# Patient Record
Sex: Female | Born: 1950 | Race: White | Hispanic: No | Marital: Single
Health system: Southern US, Community
[De-identification: ages and names within clinical notes are randomized; demographics above are authoritative.]

---

## 2020-03-06 DIAGNOSIS — U071 COVID-19: Secondary | ICD-10-CM | POA: Diagnosis not present

## 2020-03-06 DIAGNOSIS — J9621 Acute and chronic respiratory failure with hypoxia: Secondary | ICD-10-CM

## 2020-03-06 DIAGNOSIS — J1282 Pneumonia due to coronavirus disease 2019: Secondary | ICD-10-CM | POA: Diagnosis not present

## 2020-03-06 DIAGNOSIS — I5022 Chronic systolic (congestive) heart failure: Secondary | ICD-10-CM

## 2020-03-07 DIAGNOSIS — I5022 Chronic systolic (congestive) heart failure: Secondary | ICD-10-CM

## 2020-03-07 DIAGNOSIS — J1282 Pneumonia due to coronavirus disease 2019: Secondary | ICD-10-CM

## 2020-03-07 DIAGNOSIS — J9621 Acute and chronic respiratory failure with hypoxia: Secondary | ICD-10-CM | POA: Diagnosis not present

## 2020-03-07 DIAGNOSIS — U071 COVID-19: Secondary | ICD-10-CM | POA: Diagnosis not present

## 2020-03-08 DIAGNOSIS — J1282 Pneumonia due to coronavirus disease 2019: Secondary | ICD-10-CM | POA: Diagnosis not present

## 2020-03-08 DIAGNOSIS — I5022 Chronic systolic (congestive) heart failure: Secondary | ICD-10-CM | POA: Diagnosis not present

## 2020-03-08 DIAGNOSIS — J9621 Acute and chronic respiratory failure with hypoxia: Secondary | ICD-10-CM

## 2020-03-08 DIAGNOSIS — U071 COVID-19: Secondary | ICD-10-CM

## 2020-03-09 DIAGNOSIS — J9621 Acute and chronic respiratory failure with hypoxia: Secondary | ICD-10-CM

## 2020-03-09 DIAGNOSIS — I5022 Chronic systolic (congestive) heart failure: Secondary | ICD-10-CM | POA: Diagnosis not present

## 2020-03-09 DIAGNOSIS — J1282 Pneumonia due to coronavirus disease 2019: Secondary | ICD-10-CM

## 2020-03-09 DIAGNOSIS — U071 COVID-19: Secondary | ICD-10-CM

## 2020-03-10 DIAGNOSIS — J9621 Acute and chronic respiratory failure with hypoxia: Secondary | ICD-10-CM

## 2020-03-10 DIAGNOSIS — U071 COVID-19: Secondary | ICD-10-CM | POA: Diagnosis not present

## 2020-03-10 DIAGNOSIS — J1282 Pneumonia due to coronavirus disease 2019: Secondary | ICD-10-CM

## 2020-03-10 DIAGNOSIS — I5022 Chronic systolic (congestive) heart failure: Secondary | ICD-10-CM | POA: Diagnosis not present

## 2020-03-11 DIAGNOSIS — I5022 Chronic systolic (congestive) heart failure: Secondary | ICD-10-CM | POA: Diagnosis not present

## 2020-03-11 DIAGNOSIS — J1282 Pneumonia due to coronavirus disease 2019: Secondary | ICD-10-CM | POA: Diagnosis not present

## 2020-03-11 DIAGNOSIS — J9621 Acute and chronic respiratory failure with hypoxia: Secondary | ICD-10-CM | POA: Diagnosis not present

## 2020-03-11 DIAGNOSIS — U071 COVID-19: Secondary | ICD-10-CM

## 2020-03-12 DIAGNOSIS — I5022 Chronic systolic (congestive) heart failure: Secondary | ICD-10-CM | POA: Diagnosis not present

## 2020-03-12 DIAGNOSIS — J9621 Acute and chronic respiratory failure with hypoxia: Secondary | ICD-10-CM

## 2020-03-12 DIAGNOSIS — J1282 Pneumonia due to coronavirus disease 2019: Secondary | ICD-10-CM | POA: Diagnosis not present

## 2020-03-12 DIAGNOSIS — U071 COVID-19: Secondary | ICD-10-CM | POA: Diagnosis not present

## 2020-03-20 DIAGNOSIS — U071 COVID-19: Secondary | ICD-10-CM | POA: Diagnosis not present

## 2020-03-20 DIAGNOSIS — I5022 Chronic systolic (congestive) heart failure: Secondary | ICD-10-CM

## 2020-03-20 DIAGNOSIS — J9621 Acute and chronic respiratory failure with hypoxia: Secondary | ICD-10-CM | POA: Diagnosis not present

## 2020-03-20 DIAGNOSIS — J1282 Pneumonia due to coronavirus disease 2019: Secondary | ICD-10-CM | POA: Diagnosis not present

## 2020-03-21 DIAGNOSIS — J1282 Pneumonia due to coronavirus disease 2019: Secondary | ICD-10-CM | POA: Diagnosis not present

## 2020-03-21 DIAGNOSIS — J9621 Acute and chronic respiratory failure with hypoxia: Secondary | ICD-10-CM

## 2020-03-21 DIAGNOSIS — U071 COVID-19: Secondary | ICD-10-CM | POA: Diagnosis not present

## 2020-03-21 DIAGNOSIS — I5022 Chronic systolic (congestive) heart failure: Secondary | ICD-10-CM | POA: Diagnosis not present

## 2020-03-22 DIAGNOSIS — J9621 Acute and chronic respiratory failure with hypoxia: Secondary | ICD-10-CM

## 2020-03-22 DIAGNOSIS — U071 COVID-19: Secondary | ICD-10-CM

## 2020-03-22 DIAGNOSIS — I5022 Chronic systolic (congestive) heart failure: Secondary | ICD-10-CM

## 2020-03-22 DIAGNOSIS — J1282 Pneumonia due to coronavirus disease 2019: Secondary | ICD-10-CM | POA: Diagnosis not present

## 2020-03-23 DIAGNOSIS — I5022 Chronic systolic (congestive) heart failure: Secondary | ICD-10-CM | POA: Diagnosis not present

## 2020-03-23 DIAGNOSIS — J9621 Acute and chronic respiratory failure with hypoxia: Secondary | ICD-10-CM

## 2020-03-23 DIAGNOSIS — J1282 Pneumonia due to coronavirus disease 2019: Secondary | ICD-10-CM

## 2020-03-23 DIAGNOSIS — U071 COVID-19: Secondary | ICD-10-CM | POA: Diagnosis not present

## 2020-03-24 DIAGNOSIS — U071 COVID-19: Secondary | ICD-10-CM

## 2020-03-24 DIAGNOSIS — J1282 Pneumonia due to coronavirus disease 2019: Secondary | ICD-10-CM

## 2020-03-24 DIAGNOSIS — J9621 Acute and chronic respiratory failure with hypoxia: Secondary | ICD-10-CM

## 2020-03-24 DIAGNOSIS — I5022 Chronic systolic (congestive) heart failure: Secondary | ICD-10-CM | POA: Diagnosis not present

## 2020-03-25 DIAGNOSIS — J9621 Acute and chronic respiratory failure with hypoxia: Secondary | ICD-10-CM

## 2020-03-25 DIAGNOSIS — U071 COVID-19: Secondary | ICD-10-CM

## 2020-03-25 DIAGNOSIS — I5022 Chronic systolic (congestive) heart failure: Secondary | ICD-10-CM

## 2020-03-25 DIAGNOSIS — J1282 Pneumonia due to coronavirus disease 2019: Secondary | ICD-10-CM

## 2020-03-26 DIAGNOSIS — J9621 Acute and chronic respiratory failure with hypoxia: Secondary | ICD-10-CM

## 2020-03-26 DIAGNOSIS — I5022 Chronic systolic (congestive) heart failure: Secondary | ICD-10-CM | POA: Diagnosis not present

## 2020-03-26 DIAGNOSIS — J1282 Pneumonia due to coronavirus disease 2019: Secondary | ICD-10-CM

## 2020-03-26 DIAGNOSIS — U071 COVID-19: Secondary | ICD-10-CM

## 2020-04-03 DIAGNOSIS — I5022 Chronic systolic (congestive) heart failure: Secondary | ICD-10-CM

## 2020-04-03 DIAGNOSIS — U071 COVID-19: Secondary | ICD-10-CM

## 2020-04-03 DIAGNOSIS — J1282 Pneumonia due to coronavirus disease 2019: Secondary | ICD-10-CM

## 2020-04-03 DIAGNOSIS — J9621 Acute and chronic respiratory failure with hypoxia: Secondary | ICD-10-CM

## 2020-04-04 DIAGNOSIS — J1282 Pneumonia due to coronavirus disease 2019: Secondary | ICD-10-CM

## 2020-04-04 DIAGNOSIS — J9621 Acute and chronic respiratory failure with hypoxia: Secondary | ICD-10-CM

## 2020-04-04 DIAGNOSIS — I5022 Chronic systolic (congestive) heart failure: Secondary | ICD-10-CM | POA: Diagnosis not present

## 2020-04-04 DIAGNOSIS — U071 COVID-19: Secondary | ICD-10-CM

## 2020-04-05 DIAGNOSIS — J9621 Acute and chronic respiratory failure with hypoxia: Secondary | ICD-10-CM

## 2020-04-05 DIAGNOSIS — U071 COVID-19: Secondary | ICD-10-CM

## 2020-04-05 DIAGNOSIS — I5022 Chronic systolic (congestive) heart failure: Secondary | ICD-10-CM

## 2020-04-05 DIAGNOSIS — J1282 Pneumonia due to coronavirus disease 2019: Secondary | ICD-10-CM

## 2020-04-06 DIAGNOSIS — J9621 Acute and chronic respiratory failure with hypoxia: Secondary | ICD-10-CM | POA: Diagnosis not present

## 2020-04-06 DIAGNOSIS — U071 COVID-19: Secondary | ICD-10-CM | POA: Diagnosis not present

## 2020-04-06 DIAGNOSIS — I5022 Chronic systolic (congestive) heart failure: Secondary | ICD-10-CM

## 2020-04-06 DIAGNOSIS — J1282 Pneumonia due to coronavirus disease 2019: Secondary | ICD-10-CM

## 2020-04-07 DIAGNOSIS — I5022 Chronic systolic (congestive) heart failure: Secondary | ICD-10-CM

## 2020-04-07 DIAGNOSIS — J1282 Pneumonia due to coronavirus disease 2019: Secondary | ICD-10-CM | POA: Diagnosis not present

## 2020-04-07 DIAGNOSIS — U071 COVID-19: Secondary | ICD-10-CM

## 2020-04-07 DIAGNOSIS — J9621 Acute and chronic respiratory failure with hypoxia: Secondary | ICD-10-CM | POA: Diagnosis not present

## 2020-04-08 DIAGNOSIS — U071 COVID-19: Secondary | ICD-10-CM | POA: Diagnosis not present

## 2020-04-08 DIAGNOSIS — J1282 Pneumonia due to coronavirus disease 2019: Secondary | ICD-10-CM | POA: Diagnosis not present

## 2020-04-08 DIAGNOSIS — I5022 Chronic systolic (congestive) heart failure: Secondary | ICD-10-CM | POA: Diagnosis not present

## 2020-04-08 DIAGNOSIS — J9621 Acute and chronic respiratory failure with hypoxia: Secondary | ICD-10-CM

## 2020-04-09 DIAGNOSIS — U071 COVID-19: Secondary | ICD-10-CM

## 2020-04-09 DIAGNOSIS — J9621 Acute and chronic respiratory failure with hypoxia: Secondary | ICD-10-CM | POA: Diagnosis not present

## 2020-04-09 DIAGNOSIS — I5022 Chronic systolic (congestive) heart failure: Secondary | ICD-10-CM

## 2020-04-09 DIAGNOSIS — J1282 Pneumonia due to coronavirus disease 2019: Secondary | ICD-10-CM

## 2020-04-13 ENCOUNTER — Other Ambulatory Visit (HOSPITAL_COMMUNITY): Payer: Self-pay

## 2020-04-13 DIAGNOSIS — I509 Heart failure, unspecified: Secondary | ICD-10-CM

## 2020-04-15 DIAGNOSIS — J1282 Pneumonia due to coronavirus disease 2019: Secondary | ICD-10-CM | POA: Diagnosis not present

## 2020-04-15 DIAGNOSIS — U071 COVID-19: Secondary | ICD-10-CM | POA: Diagnosis not present

## 2020-04-15 DIAGNOSIS — I5022 Chronic systolic (congestive) heart failure: Secondary | ICD-10-CM | POA: Diagnosis not present

## 2020-04-15 DIAGNOSIS — J9621 Acute and chronic respiratory failure with hypoxia: Secondary | ICD-10-CM | POA: Diagnosis not present

## 2020-04-16 DIAGNOSIS — J1282 Pneumonia due to coronavirus disease 2019: Secondary | ICD-10-CM | POA: Diagnosis not present

## 2020-04-16 DIAGNOSIS — U071 COVID-19: Secondary | ICD-10-CM | POA: Diagnosis not present

## 2020-04-16 DIAGNOSIS — J9621 Acute and chronic respiratory failure with hypoxia: Secondary | ICD-10-CM

## 2020-04-16 DIAGNOSIS — I5022 Chronic systolic (congestive) heart failure: Secondary | ICD-10-CM

## 2020-04-17 DIAGNOSIS — I5022 Chronic systolic (congestive) heart failure: Secondary | ICD-10-CM

## 2020-04-17 DIAGNOSIS — J1282 Pneumonia due to coronavirus disease 2019: Secondary | ICD-10-CM

## 2020-04-17 DIAGNOSIS — U071 COVID-19: Secondary | ICD-10-CM | POA: Diagnosis not present

## 2020-04-17 DIAGNOSIS — J9621 Acute and chronic respiratory failure with hypoxia: Secondary | ICD-10-CM

## 2020-04-18 DIAGNOSIS — U071 COVID-19: Secondary | ICD-10-CM | POA: Diagnosis not present

## 2020-04-18 DIAGNOSIS — I5022 Chronic systolic (congestive) heart failure: Secondary | ICD-10-CM

## 2020-04-18 DIAGNOSIS — J9621 Acute and chronic respiratory failure with hypoxia: Secondary | ICD-10-CM

## 2020-04-18 DIAGNOSIS — J1282 Pneumonia due to coronavirus disease 2019: Secondary | ICD-10-CM

## 2020-04-19 ENCOUNTER — Emergency Department (HOSPITAL_COMMUNITY): Payer: Medicare HMO

## 2020-04-19 ENCOUNTER — Emergency Department (HOSPITAL_COMMUNITY)
Admission: EM | Admit: 2020-04-19 | Discharge: 2020-04-20 | Disposition: A | Discharge status: Home / Self Care | Payer: Medicare HMO | Attending: Emergency Medicine | Admitting: Emergency Medicine

## 2020-04-19 DIAGNOSIS — I509 Heart failure, unspecified: Secondary | ICD-10-CM | POA: Insufficient documentation

## 2020-04-19 DIAGNOSIS — N183 Chronic kidney disease, stage 3 unspecified: Secondary | ICD-10-CM | POA: Insufficient documentation

## 2020-04-19 DIAGNOSIS — R609 Edema, unspecified: Secondary | ICD-10-CM | POA: Insufficient documentation

## 2020-04-19 DIAGNOSIS — I4891 Unspecified atrial fibrillation: Secondary | ICD-10-CM | POA: Diagnosis not present

## 2020-04-19 DIAGNOSIS — Z20822 Contact with and (suspected) exposure to covid-19: Secondary | ICD-10-CM | POA: Insufficient documentation

## 2020-04-19 DIAGNOSIS — R040 Epistaxis: Secondary | ICD-10-CM | POA: Diagnosis present

## 2020-04-19 DIAGNOSIS — I5022 Chronic systolic (congestive) heart failure: Secondary | ICD-10-CM | POA: Diagnosis not present

## 2020-04-19 DIAGNOSIS — U071 COVID-19: Secondary | ICD-10-CM

## 2020-04-19 DIAGNOSIS — Z8616 Personal history of COVID-19: Secondary | ICD-10-CM | POA: Insufficient documentation

## 2020-04-19 DIAGNOSIS — J1282 Pneumonia due to coronavirus disease 2019: Secondary | ICD-10-CM

## 2020-04-19 DIAGNOSIS — Z7901 Long term (current) use of anticoagulants: Secondary | ICD-10-CM | POA: Diagnosis not present

## 2020-04-19 DIAGNOSIS — I130 Hypertensive heart and chronic kidney disease with heart failure and stage 1 through stage 4 chronic kidney disease, or unspecified chronic kidney disease: Secondary | ICD-10-CM | POA: Diagnosis not present

## 2020-04-19 DIAGNOSIS — J9621 Acute and chronic respiratory failure with hypoxia: Secondary | ICD-10-CM | POA: Diagnosis not present

## 2020-04-19 DIAGNOSIS — R69 Illness, unspecified: Secondary | ICD-10-CM

## 2020-04-19 LAB — BASIC METABOLIC PANEL
Anion gap: 14 (ref 5–15)
BUN: 50 mg/dL — ABNORMAL HIGH (ref 8–23)
CO2: 18 mmol/L — ABNORMAL LOW (ref 22–32)
Calcium: 9.2 mg/dL (ref 8.9–10.3)
Chloride: 105 mmol/L (ref 98–111)
Creatinine, Ser: 2.06 mg/dL — ABNORMAL HIGH (ref 0.44–1.00)
GFR, Estimated: 26 mL/min — ABNORMAL LOW (ref >60)
Glucose, Bld: 105 mg/dL — ABNORMAL HIGH (ref 70–99)
Potassium: 4.7 mmol/L (ref 3.5–5.1)
Sodium: 137 mmol/L (ref 135–145)

## 2020-04-19 LAB — CBC
HCT: 25.5 % — ABNORMAL LOW (ref 36.0–46.0)
Hemoglobin: 8.3 g/dL — ABNORMAL LOW (ref 12.0–15.0)
MCH: 30.3 pg (ref 26.0–34.0)
MCHC: 32.5 g/dL (ref 30.0–36.0)
MCV: 93.1 fL (ref 80.0–100.0)
Platelets: 119 10*3/uL — ABNORMAL LOW (ref 150–400)
RBC: 2.74 MIL/uL — ABNORMAL LOW (ref 3.87–5.11)
RDW: 21.7 % — ABNORMAL HIGH (ref 11.5–15.5)
WBC: 5.4 10*3/uL (ref 4.0–10.5)
nRBC: 0.6 % — ABNORMAL HIGH (ref 0.0–0.2)

## 2020-04-19 LAB — PROTIME-INR
INR: 1.5 — ABNORMAL HIGH (ref 0.8–1.2)
Prothrombin Time: 17.9 seconds — ABNORMAL HIGH (ref 11.4–15.2)

## 2020-04-19 LAB — RESP PANEL BY RT-PCR (FLU A&B, COVID) ARPGX2
Influenza A by PCR: NEGATIVE
Influenza B by PCR: NEGATIVE
SARS Coronavirus 2 by RT PCR: NEGATIVE

## 2020-04-19 MED ORDER — IPRATROPIUM-ALBUTEROL 0.5-2.5 (3) MG/3ML IN SOLN
3.0000 mL | Freq: Once | RESPIRATORY_TRACT | Status: AC
  Administered 2020-04-19: 3 mL via RESPIRATORY_TRACT
  Filled 2020-04-19: qty 3

## 2020-04-19 NOTE — ED Notes (Signed)
Rapid Rhino applied to L nostril by Dr. Donnald Garre

## 2020-04-19 NOTE — Discharge Instructions (Addendum)
1.  A packing was placed in your nose.  This needs to be removed in 48 hours.  This can be done at your family doctor, the ear nose throat specialist or the emergency department. 2.  You will always have increased risk of nasal bleeding when on blood thinners.  At this time, your blood thinner was stopped today.  You should see decreased bleeding. 3.  Return to the emergency department you have significant bleeding around the packing.  If you have a minor trickle of blood you can apply pressure for 5 minutes and see if it stops.  If it continues despite efforts at direct pressure, return to the emergency department. 4.  Your hemoglobin level is stable.  You did not lose enough blood to decrease your blood count.

## 2020-04-19 NOTE — ED Provider Notes (Signed)
[facility] EMERGENCY DEPARTMENT Provider Note   CSN: [id number] Arrival date & time: 04/19/20  1906     History No chief complaint on file.   Kristina Stevens is a 70 y.o. female.  HPI Patient is sent from [facility] nursing facility for persistent nosebleed.  Reportedly patient has had continuous bleeding for several hours.  They have tried applying pressure without success.  Patient is anticoagulated on Eliquis.  She has had previous history of nosebleeds.  Patient does have chronic respiratory failure with hypoxia and longstanding tracheostomy.  Bleeding is from the left nare.  EMS applied some Afrin and had temporary decrease in bleeding.  However promptly resumed and they arrived holding pressure.  In December, patient had COVID with respiratory complications and fall resulting in hospitalization at [facility] for rehabilitation    No past medical history on file.  Multiple medical problems including heart failure, severe pulmonary hypertension, atrial fibrillation on anticoagulation, essential hypertension, hyperlipidemia, chronic kidney disease stage III     OB History   No obstetric history on file.     No family history on file.     Home Medications Prior to Admission medications   Not on File    Allergies    Patient has no allergy information on record.  Review of Systems   Review of Systems Level 5 caveat review of systems cannot be obtained due to acute condition of patient Physical Exam Updated Vital Signs BP 122/89   Pulse (!) 101   Temp 97.6 F (36.4 C) (Oral)   Resp (!) 22   SpO2 95%   Physical Exam Constitutional:      Comments: Patient arrives with extensive blood around the nose and mouth.  Raspiness to respirations.  Patient does have a tracheostomy in place.  She is awake and alert.  Following commands.  HENT:     Head: Normocephalic and atraumatic.     Nose:     Comments: Large clot present in the left nare.  Clot hanging down  posteriorly in the posterior oropharynx.  Fresh blood around the nose and mouth. Eyes:     Comments: Patient is legally blind.  She has some proptosis and wandering eye motions.  Cardiovascular:     Comments: Tachycardia.  Cannot appreciate rub murmur gallop.  Breath sounds are coarse. Pulmonary:     Comments: Mild increased work of breathing.  Coarse breath sounds throughout with expiratory wheeze.  Patient has tracheostomy in place Abdominal:     General: There is no distension.     Palpations: Abdomen is soft.  Musculoskeletal:     Comments: 2+ edema bilaterally.  Some pretibial erythema consistent with chronic venous stasis.  Neurological:     Comments: Patient is alert.  She is assisting in following commands.  No apparent focal motor deficit.     ED Results / Procedures / Treatments   Labs (all labs ordered are listed, but only abnormal results are displayed) Labs Reviewed  BASIC METABOLIC PANEL - Abnormal; Notable for the following components:      Result Value   CO2 18 (*)    Glucose, Bld 105 (*)    BUN 50 (*)    Creatinine, Ser 2.06 (*)    GFR, Estimated 26 (*)    All other components within normal limits  CBC - Abnormal; Notable for the following components:   RBC 2.74 (*)    Hemoglobin 8.3 (*)    HCT 25.5 (*)    RDW 21.7 (*)  Platelets 119 (*)    nRBC 0.6 (*)    All other components within normal limits  PROTIME-INR - Abnormal; Notable for the following components:   Prothrombin Time 17.9 (*)    INR 1.5 (*)    All other components within normal limits  RESP PANEL BY RT-PCR (FLU A&B, COVID) [id number]    EKG None  Radiology DG Chest Port 1 View  Result Date: 04/19/2020 CLINICAL DATA:  Wheezing. EXAM: PORTABLE CHEST 1 VIEW COMPARISON:  None. FINDINGS: Marked cardiomegaly. Lungs appear clear. No pleural effusion or pneumothorax is seen. Tracheostomy tube in place. IMPRESSION: No active disease. No evidence of pneumonia or pulmonary edema. Marked cardiomegaly.  Electronically Signed   By: Bary Richard M.D.   On: 04/19/2020 20:24    Procedures .Epistaxis Management  Date/Time: 04/19/2020 7:55 PM Performed by: Arby Barrette, MD Authorized by: Arby Barrette, MD   Consent:    Consent obtained:  Emergent situation   Risks discussed:  Bleeding, nasal injury and pain Universal protocol:    Patient identity confirmed:  Verbally with patient Anesthesia:    Anesthesia method:  None Procedure details:    Treatment site:  L posterior and L anterior   Treatment method:  Nasal balloon   Treatment complexity:  Extensive   Treatment episode: initial   Post-procedure details:    Assessment:  Bleeding stopped   Procedure completion:  Tolerated Comments:     1.  Suction and coached patient nasal blowing used to clear very large clot.  Clot cleared confirmed that air is passing clearly through left nare.  Rapid Rhino 900 anterior posterior nasal balloon placed without difficulty.  Posterior and anterior balloons inflated with approximately 5 cc posterior balloon and 7 cc anterior balloon.  Leading stopped.     Medications Ordered in ED Medications  ipratropium-albuterol (DUONEB) 0.5-2.5 (3) MG/3ML nebulizer solution 3 mL (3 mLs Nebulization Given 04/19/20 1956)    ED Course  I have reviewed the triage vital signs and the nursing notes.  Pertinent labs & imaging results that were available during my care of the patient were reviewed by me and considered in my medical decision making (see chart for details).    MDM Rules/Calculators/A&P                         Recheck 19: 50 no recurrence of rebleeding.  Patient is comfortable and stable in appearance.  Her tracheostomy has been suctioned, she is on supplemental oxygen.  Will administer DuoNeb.  Recheck 23: 40 patient is resting quietly.  Oxygen saturation 96%.  There has been no rebleeding.  Patient's hemoglobin is stable.  At this time, stable to return to nursing home care.  Patient's Eliquis  was discontinued today for an impending procedure.  Recommendation is for nasal packing removal in 48 hours PCP or ENT. Final Clinical Impression(s) / ED Diagnoses Final diagnoses:  Epistaxis  Severe comorbid illness    Rx / DC Orders ED Discharge Orders    None       Arby Barrette, MD 04/19/20 2351

## 2020-04-19 NOTE — ED Notes (Signed)
MD at bedside with RN and EMS, ENT cart present.

## 2020-04-19 NOTE — ED Triage Notes (Signed)
Pt BIB EMS from [facility] facility. Hx of nosebleeds, facility says this began roughly 2.5 hours ago. Bleeding through L nostril, hemorrhaging and requiring nose to be held shut.  Pt on eliquis, stopped today for upcoming surgery.   1 spray Afran given by EMS, appeared to help until pt stood up with EMS and began to hemorrhage further.   Pt has trach and is arrives on NRM, sats in the low to mid 90s.

## 2020-04-19 NOTE — ED Notes (Signed)
Pt states that she was diagnosed with covid 12/26

## 2020-04-20 DIAGNOSIS — J9621 Acute and chronic respiratory failure with hypoxia: Secondary | ICD-10-CM | POA: Diagnosis not present

## 2020-04-20 DIAGNOSIS — I5022 Chronic systolic (congestive) heart failure: Secondary | ICD-10-CM

## 2020-04-20 DIAGNOSIS — U071 COVID-19: Secondary | ICD-10-CM | POA: Diagnosis not present

## 2020-04-20 DIAGNOSIS — J1282 Pneumonia due to coronavirus disease 2019: Secondary | ICD-10-CM

## 2020-04-20 NOTE — ED Notes (Signed)
Lupita Leash daughter [phone] wants an update and call back please

## 2020-04-20 NOTE — ED Notes (Signed)
This RN called report to [facility] RN

## 2020-04-21 DIAGNOSIS — J1282 Pneumonia due to coronavirus disease 2019: Secondary | ICD-10-CM | POA: Diagnosis not present

## 2020-04-21 DIAGNOSIS — J9621 Acute and chronic respiratory failure with hypoxia: Secondary | ICD-10-CM | POA: Diagnosis not present

## 2020-04-21 DIAGNOSIS — I5022 Chronic systolic (congestive) heart failure: Secondary | ICD-10-CM

## 2020-04-21 DIAGNOSIS — U071 COVID-19: Secondary | ICD-10-CM

## 2020-04-22 DIAGNOSIS — J9621 Acute and chronic respiratory failure with hypoxia: Secondary | ICD-10-CM

## 2020-04-22 DIAGNOSIS — I5022 Chronic systolic (congestive) heart failure: Secondary | ICD-10-CM

## 2020-04-22 DIAGNOSIS — J1282 Pneumonia due to coronavirus disease 2019: Secondary | ICD-10-CM

## 2020-04-22 DIAGNOSIS — U071 COVID-19: Secondary | ICD-10-CM | POA: Diagnosis not present

## 2020-04-23 DIAGNOSIS — I5022 Chronic systolic (congestive) heart failure: Secondary | ICD-10-CM | POA: Diagnosis not present

## 2020-04-23 DIAGNOSIS — J9621 Acute and chronic respiratory failure with hypoxia: Secondary | ICD-10-CM

## 2020-04-23 DIAGNOSIS — U071 COVID-19: Secondary | ICD-10-CM | POA: Diagnosis not present

## 2020-04-23 DIAGNOSIS — J1282 Pneumonia due to coronavirus disease 2019: Secondary | ICD-10-CM

## 2020-05-01 DIAGNOSIS — U071 COVID-19: Secondary | ICD-10-CM

## 2020-05-01 DIAGNOSIS — I5022 Chronic systolic (congestive) heart failure: Secondary | ICD-10-CM

## 2020-05-01 DIAGNOSIS — J9621 Acute and chronic respiratory failure with hypoxia: Secondary | ICD-10-CM

## 2020-05-01 DIAGNOSIS — J1282 Pneumonia due to coronavirus disease 2019: Secondary | ICD-10-CM

## 2020-05-02 DIAGNOSIS — J1282 Pneumonia due to coronavirus disease 2019: Secondary | ICD-10-CM

## 2020-05-02 DIAGNOSIS — U071 COVID-19: Secondary | ICD-10-CM

## 2020-05-02 DIAGNOSIS — I5022 Chronic systolic (congestive) heart failure: Secondary | ICD-10-CM | POA: Diagnosis not present

## 2020-05-02 DIAGNOSIS — J9621 Acute and chronic respiratory failure with hypoxia: Secondary | ICD-10-CM

## 2020-05-03 DIAGNOSIS — J1282 Pneumonia due to coronavirus disease 2019: Secondary | ICD-10-CM

## 2020-05-03 DIAGNOSIS — J9621 Acute and chronic respiratory failure with hypoxia: Secondary | ICD-10-CM | POA: Diagnosis not present

## 2020-05-03 DIAGNOSIS — U071 COVID-19: Secondary | ICD-10-CM | POA: Diagnosis not present

## 2020-05-03 DIAGNOSIS — I5022 Chronic systolic (congestive) heart failure: Secondary | ICD-10-CM

## 2020-05-04 ENCOUNTER — Ambulatory Visit (HOSPITAL_COMMUNITY): Payer: Medicare HMO

## 2020-05-04 DIAGNOSIS — J1282 Pneumonia due to coronavirus disease 2019: Secondary | ICD-10-CM

## 2020-05-04 DIAGNOSIS — U071 COVID-19: Secondary | ICD-10-CM

## 2020-05-04 DIAGNOSIS — I5022 Chronic systolic (congestive) heart failure: Secondary | ICD-10-CM

## 2020-05-04 DIAGNOSIS — J9621 Acute and chronic respiratory failure with hypoxia: Secondary | ICD-10-CM

## 2020-05-05 DIAGNOSIS — I5022 Chronic systolic (congestive) heart failure: Secondary | ICD-10-CM

## 2020-05-05 DIAGNOSIS — U071 COVID-19: Secondary | ICD-10-CM

## 2020-05-05 DIAGNOSIS — J1282 Pneumonia due to coronavirus disease 2019: Secondary | ICD-10-CM

## 2020-05-05 DIAGNOSIS — J9621 Acute and chronic respiratory failure with hypoxia: Secondary | ICD-10-CM

## 2020-05-06 DIAGNOSIS — J1282 Pneumonia due to coronavirus disease 2019: Secondary | ICD-10-CM

## 2020-05-06 DIAGNOSIS — I5022 Chronic systolic (congestive) heart failure: Secondary | ICD-10-CM | POA: Diagnosis not present

## 2020-05-06 DIAGNOSIS — U071 COVID-19: Secondary | ICD-10-CM

## 2020-05-06 DIAGNOSIS — J9621 Acute and chronic respiratory failure with hypoxia: Secondary | ICD-10-CM

## 2020-05-07 DIAGNOSIS — J1282 Pneumonia due to coronavirus disease 2019: Secondary | ICD-10-CM | POA: Diagnosis not present

## 2020-05-07 DIAGNOSIS — J9621 Acute and chronic respiratory failure with hypoxia: Secondary | ICD-10-CM | POA: Diagnosis not present

## 2020-05-07 DIAGNOSIS — I5022 Chronic systolic (congestive) heart failure: Secondary | ICD-10-CM

## 2020-05-07 DIAGNOSIS — U071 COVID-19: Secondary | ICD-10-CM

## 2020-12-19 DEATH — deceased

## 2021-11-06 IMAGING — DX DG CHEST 1V PORT
1 series · 1 of 1 positions shown · non-contrast
Comparison: None.

CLINICAL DATA: Wheezing.

EXAM:
PORTABLE CHEST 1 VIEW

[chest ap]
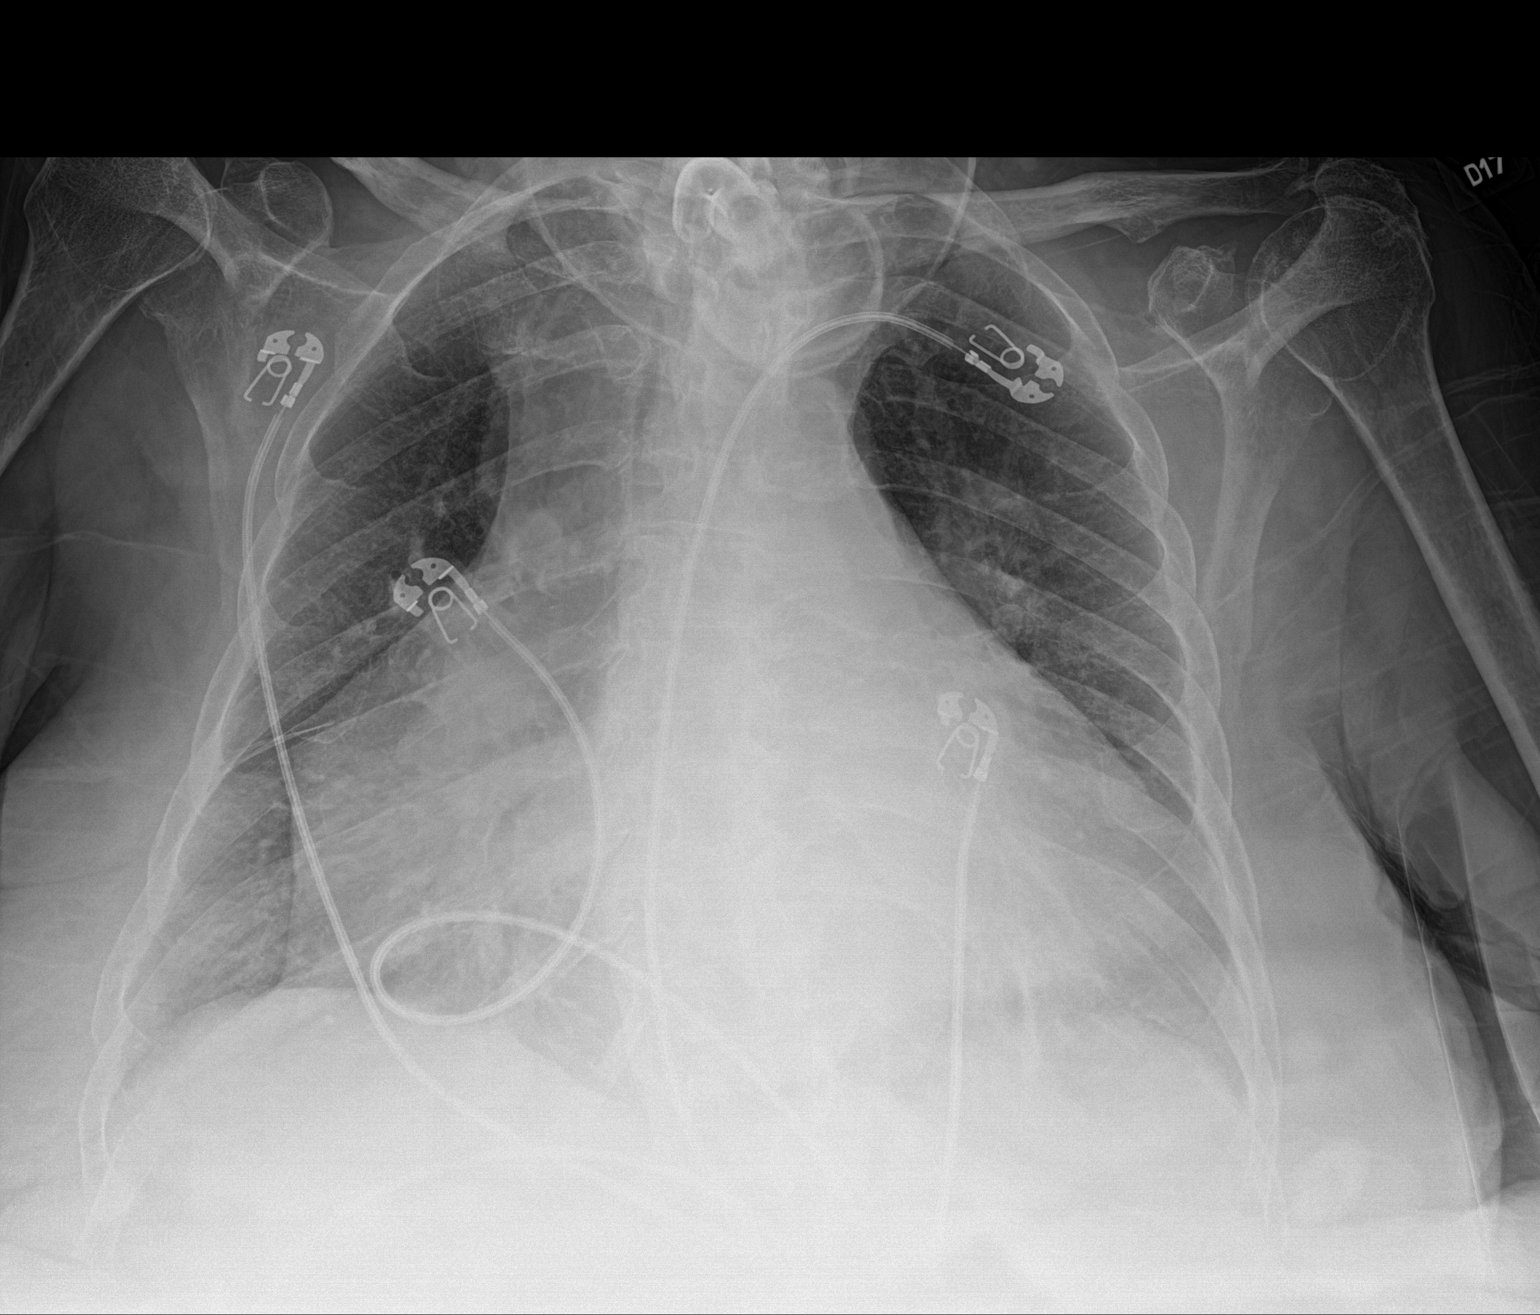

[1 of 1 positions shown; findings below may reference images not displayed]

FINDINGS: Marked cardiomegaly. Lungs appear clear. No pleural effusion or
pneumothorax is seen. Tracheostomy tube in place.
IMPRESSION: No active disease. No evidence of pneumonia or pulmonary edema.
Marked cardiomegaly.
